# Patient Record
Sex: Male | Born: 1990 | Race: White | Hispanic: No | Marital: Married
Health system: Western US, Academic
[De-identification: ages and names within clinical notes are randomized; demographics above are authoritative.]

## PROBLEM LIST (undated history)

## (undated) DIAGNOSIS — F419 Anxiety disorder, unspecified: Secondary | ICD-10-CM

## (undated) DIAGNOSIS — F319 Bipolar disorder, unspecified: Secondary | ICD-10-CM

## (undated) DIAGNOSIS — F4310 Post-traumatic stress disorder, unspecified: Secondary | ICD-10-CM

## (undated) MED ORDER — ACETAMINOPHEN 325 MG PO TABS
650.00 mg | ORAL_TABLET | Freq: Four times a day (QID) | ORAL | Status: AC | PRN
Start: 2022-01-04 — End: 2022-01-05

## (undated) MED ORDER — NICOTINE POLACRILEX 2 MG MT GUM
2.00 mg | CHEWING_GUM | OROMUCOSAL | Status: AC | PRN
Start: 2022-01-04 — End: ?

## (undated) MED ORDER — NALOXONE HCL 0.4 MG/ML IJ SOLN
0.10 mg | INTRAMUSCULAR | Status: AC | PRN
Start: 2022-01-04 — End: 2022-01-05

## (undated) MED ORDER — ONDANSETRON HCL 4 MG OR TABS
4.00 mg | ORAL_TABLET | Freq: Four times a day (QID) | ORAL | Status: AC | PRN
Start: 2022-01-04 — End: 2022-01-05

---
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
## 2018-04-29 ENCOUNTER — Encounter (HOSPITAL_COMMUNITY): Payer: Self-pay | Admitting: Emergency Medicine

## 2018-04-29 ENCOUNTER — Emergency Department (HOSPITAL_COMMUNITY)
Admission: EM | Admit: 2018-04-29 | Discharge: 2018-04-29 | Disposition: A | Discharge status: Home / Self Care | Payer: Medicaid Other | Attending: Emergency Medicine | Admitting: Emergency Medicine

## 2018-04-29 ENCOUNTER — Emergency Department (HOSPITAL_COMMUNITY): Payer: Medicaid Other

## 2018-04-29 ENCOUNTER — Other Ambulatory Visit: Payer: Self-pay

## 2018-04-29 DIAGNOSIS — F17210 Nicotine dependence, cigarettes, uncomplicated: Secondary | ICD-10-CM | POA: Insufficient documentation

## 2018-04-29 DIAGNOSIS — Z880 Allergy status to penicillin: Secondary | ICD-10-CM | POA: Diagnosis not present

## 2018-04-29 DIAGNOSIS — Y939 Activity, unspecified: Secondary | ICD-10-CM | POA: Diagnosis not present

## 2018-04-29 DIAGNOSIS — S0990XA Unspecified injury of head, initial encounter: Secondary | ICD-10-CM | POA: Diagnosis present

## 2018-04-29 DIAGNOSIS — Z590 Homelessness: Secondary | ICD-10-CM | POA: Insufficient documentation

## 2018-04-29 DIAGNOSIS — Y929 Unspecified place or not applicable: Secondary | ICD-10-CM | POA: Diagnosis not present

## 2018-04-29 DIAGNOSIS — S060X1A Concussion with loss of consciousness of 30 minutes or less, initial encounter: Secondary | ICD-10-CM | POA: Insufficient documentation

## 2018-04-29 DIAGNOSIS — Y999 Unspecified external cause status: Secondary | ICD-10-CM | POA: Diagnosis not present

## 2018-04-29 DIAGNOSIS — M545 Low back pain: Secondary | ICD-10-CM | POA: Insufficient documentation

## 2018-04-29 HISTORY — DX: Anxiety disorder, unspecified: F41.9

## 2018-04-29 HISTORY — DX: Post-traumatic stress disorder, unspecified: F43.10

## 2018-04-29 HISTORY — DX: Bipolar disorder, unspecified: F31.9

## 2018-04-29 MED ORDER — ACETAMINOPHEN 325 MG PO TABS
650.0000 mg | ORAL_TABLET | Freq: Once | ORAL | Status: AC
  Administered 2018-04-29: 650 mg via ORAL
  Filled 2018-04-29: qty 2

## 2018-04-29 NOTE — Discharge Instructions (Addendum)
Please read instructions below. Apply ice to your areas of pain for 20 minutes at a time. You can take tylenol every 6 hours as needed for pain. Avoid contact sports/activity until you are completely symptom free or until cleared by a physician. Schedule an appointment with your primary care provider to follow up on your visit today. Return to the ER for severely worsening headache, vision changes, if new numbness or tingling in your arms or legs, inability to urinate, inability to hold your bowels, or weakness in your extremities.

## 2018-04-29 NOTE — ED Notes (Signed)
Pt seen by [employer], given homeless shelters in [place]. Will be provided with bus pass for transport to [place].
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
## 2018-04-29 NOTE — ED Notes (Signed)
CT resulted, Pt provided food and an update

## 2018-04-29 NOTE — ED Notes (Signed)
Pt requesting to see his girl and to eat, stating "what room is my girl in, I haven't had anything to f*cking eat in 24hours.  Don't you think that's unsafe." Pt made aware that after CT scan results he will be able to eat.  Pt requesting to see charge RN.

## 2018-04-29 NOTE — ED Notes (Addendum)
Pt requesting social worker regarding resources on places to stay. States that it's not safe for him in [place].  RN will alert PA

## 2018-04-29 NOTE — ED Notes (Signed)
Patient verbalizes understanding of discharge instructions. Opportunity for questioning and answers were provided. Pt discharged from ED. 
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
## 2018-04-29 NOTE — ED Notes (Signed)
Pt transported to CT ?
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
## 2022-01-03 ENCOUNTER — Emergency Department (HOSPITAL_BASED_OUTPATIENT_CLINIC_OR_DEPARTMENT_OTHER): Payer: Medicaid - Out of State

## 2022-01-03 ENCOUNTER — Inpatient Hospital Stay
Admission: EM | Admit: 2022-01-03 | Discharge: 2022-01-04 | DRG: 918 | Disposition: A | Discharge status: Home / Self Care | Payer: Medicaid - Out of State | Attending: Hospitalist | Admitting: Hospitalist

## 2022-01-03 DIAGNOSIS — Z559 Problems related to education and literacy, unspecified: Secondary | ICD-10-CM

## 2022-01-03 DIAGNOSIS — Z043 Encounter for examination and observation following other accident: Secondary | ICD-10-CM

## 2022-01-03 DIAGNOSIS — Z87892 Personal history of anaphylaxis: Secondary | ICD-10-CM

## 2022-01-03 DIAGNOSIS — T402X1A Poisoning by other opioids, accidental (unintentional), initial encounter: Principal | ICD-10-CM | POA: Diagnosis present

## 2022-01-03 DIAGNOSIS — Z8249 Family history of ischemic heart disease and other diseases of the circulatory system: Secondary | ICD-10-CM

## 2022-01-03 DIAGNOSIS — R569 Unspecified convulsions: Secondary | ICD-10-CM | POA: Diagnosis present

## 2022-01-03 DIAGNOSIS — F1510 Other stimulant abuse, uncomplicated: Secondary | ICD-10-CM | POA: Diagnosis present

## 2022-01-03 DIAGNOSIS — Z880 Allergy status to penicillin: Secondary | ICD-10-CM

## 2022-01-03 DIAGNOSIS — Z5900 Homelessness unspecified: Secondary | ICD-10-CM

## 2022-01-03 DIAGNOSIS — G8929 Other chronic pain: Secondary | ICD-10-CM | POA: Diagnosis present

## 2022-01-03 DIAGNOSIS — Z659 Problem related to unspecified psychosocial circumstances: Secondary | ICD-10-CM

## 2022-01-03 DIAGNOSIS — E8729 Other acidosis: Secondary | ICD-10-CM | POA: Diagnosis present

## 2022-01-03 DIAGNOSIS — Z87820 Personal history of traumatic brain injury: Secondary | ICD-10-CM

## 2022-01-03 DIAGNOSIS — Z881 Allergy status to other antibiotic agents status: Secondary | ICD-10-CM

## 2022-01-03 LAB — CBC WITH DIFF, BLOOD
ANC-Automated: 5.1 10*3/uL (ref 1.6–7.0)
Abs Basophils: 0 10*3/uL (ref <0.2)
Abs Eosinophils: 0.1 10*3/uL (ref 0.0–0.5)
Abs Lymphs: 1.7 10*3/uL (ref 0.8–3.1)
Abs Monos: 0.9 10*3/uL — ABNORMAL HIGH (ref 0.2–0.8)
Basophils: 0 %
Eosinophils: 1 %
Hct: 41.3 % (ref 40.0–50.0)
Hgb: 13.9 gm/dL (ref 13.7–17.5)
Lymphocytes: 22 %
MCH: 30.3 pg (ref 26.0–32.0)
MCHC: 33.7 g/dL (ref 32.0–36.0)
MCV: 90 um3 (ref 79.0–95.0)
MPV: 8.8 fL — ABNORMAL LOW (ref 9.4–12.4)
Monocytes: 11 %
Plt Count: 233 10*3/uL (ref 140–370)
RBC: 4.59 10*6/uL — ABNORMAL LOW (ref 4.60–6.10)
RDW: 13.2 % (ref 12.0–14.0)
Segs: 65 %
WBC: 7.8 10*3/uL (ref 4.0–10.0)

## 2022-01-03 LAB — COMPREHENSIVE METABOLIC PANEL, BLOOD
ALT (SGPT): 24 U/L (ref 0–41)
AST (SGOT): 34 U/L (ref 0–40)
Albumin: 4 g/dL (ref 3.5–5.2)
Alkaline Phos: 91 U/L (ref 40–129)
Anion Gap: 9 mmol/L (ref 7–15)
BUN: 11 mg/dL (ref 6–20)
Bicarbonate: 25 mmol/L (ref 22–29)
Bilirubin, Tot: 0.28 mg/dL (ref <1.2)
Calcium: 9.1 mg/dL (ref 8.5–10.6)
Chloride: 107 mmol/L (ref 98–107)
Creatinine: 0.69 mg/dL (ref 0.67–1.17)
Glucose: 107 mg/dL — ABNORMAL HIGH (ref 70–99)
Potassium: 3.3 mmol/L — ABNORMAL LOW (ref 3.5–5.1)
Sodium: 141 mmol/L (ref 136–145)
Total Protein: 7 g/dL (ref 6.0–8.0)
eGFR Based on CKD-EPI 2021 Equation: >60 mL/min/{1.73_m2}

## 2022-01-03 LAB — ECG 12-LEAD
ATRIAL RATE: 62 {beats}/min
P AXIS: 74 degrees
PR INTERVAL: 136 ms
QRS INTERVAL/DURATION: 86 ms
QT: 432 ms
QTc (Bazett): 438 ms
R AXIS: 84 degrees
T AXIS: 67 degrees
VENTRICULAR RATE: 62 {beats}/min

## 2022-01-03 LAB — VBG+O2HBV+O2S+O2CNV
BE, Ven: -2.1 mmol/L (ref <=1.2)
Flow Rate, Ven: 2 L/min
HCO3, Ven: 23 mmol/L — ABNORMAL LOW (ref 25–30)
Hct (Est), Ven: 46 % (ref 40–50)
Hgb, Ven: 15.3 g/dL (ref 14.0–17.0)
O2 Content, Ven: 20 vol %
O2 Hgb, Ven: 93 % — ABNORMAL HIGH (ref 40.0–70.0)
O2 Sat, Ven: 95.8 %
Temp, Ven: 36.7 'C
pCO2, Ven (T): 64 mmHg — ABNORMAL HIGH (ref 40–52)
pCO2, Ven (Uncorr): 65 mmHg — ABNORMAL HIGH (ref 40–52)
pH, Ven (T): 7.23 — ABNORMAL LOW (ref 7.33–7.40)
pH, Ven (Uncorr): 7.23 — ABNORMAL LOW (ref 7.33–7.40)
pO2, Ven (T): 83 mmHg — ABNORMAL HIGH (ref 25–44)
pO2, Ven (Uncorr): 85 mmHg — ABNORMAL HIGH (ref 25–44)

## 2022-01-03 LAB — VENOUS BLOOD GAS
BE, Ven: 1.7 mmol/L — ABNORMAL HIGH (ref <=1.2)
Flow Rate, Ven: 2 L/min
HCO3, Ven: 25 mmol/L (ref 25–30)
O2 Sat, Ven (Est): 63.7 %
Temp, Ven: 36.3 'C
pCO2, Ven (T): 49 mmHg (ref 40–52)
pCO2, Ven (Uncorr): 51 mmHg (ref 40–52)
pH, Ven (T): 7.36 (ref 7.33–7.40)
pH, Ven (Uncorr): 7.35 (ref 7.33–7.40)
pO2, Ven (T): 33 mmHg (ref 25–44)
pO2, Ven (Uncorr): 35 mmHg (ref 25–44)

## 2022-01-03 MED ORDER — SODIUM CHLORIDE 0.9 % IJ SOLN (CUSTOM)
3.0000 mL | Freq: Three times a day (TID) | INTRAMUSCULAR | Status: DC
Start: 2022-01-03 — End: 2022-01-04
  Administered 2022-01-03 – 2022-01-04 (×2): 3 mL via INTRAVENOUS

## 2022-01-03 MED ORDER — NALOXONE HCL 2 MG/2ML IJ SOSY
1.0000 mg | PREFILLED_SYRINGE | Freq: Once | INTRAMUSCULAR | Status: AC
Start: 2022-01-03 — End: 2022-01-03
  Administered 2022-01-03: 1 mg via INTRAVENOUS

## 2022-01-03 MED ORDER — ENOXAPARIN SODIUM 40 MG/0.4ML IJ SOSY
40.0000 mg | PREFILLED_SYRINGE | Freq: Every day | INTRAMUSCULAR | Status: DC
Start: 2022-01-04 — End: 2022-01-04
  Administered 2022-01-04: 40 mg via SUBCUTANEOUS
  Filled 2022-01-03: qty 1

## 2022-01-03 MED ORDER — SODIUM CHLORIDE 0.9% TKO INFUSION
INTRAVENOUS | Status: DC | PRN
Start: 2022-01-03 — End: 2022-01-04

## 2022-01-03 MED ORDER — NALOXONE HCL 2 MG/2ML IJ SOSY
1.0000 mg | PREFILLED_SYRINGE | Freq: Once | INTRAMUSCULAR | Status: DC
Start: 2022-01-03 — End: 2022-01-03
  Filled 2022-01-03: qty 2

## 2022-01-03 MED ORDER — SODIUM CHLORIDE 0.9 % IJ SOLN (CUSTOM)
3.0000 mL | INTRAMUSCULAR | Status: DC | PRN
Start: 2022-01-03 — End: 2022-01-04

## 2022-01-03 MED ORDER — ACETAMINOPHEN 325 MG PO TABS
650.0000 mg | ORAL_TABLET | Freq: Four times a day (QID) | ORAL | Status: DC | PRN
Start: 2022-01-03 — End: 2022-01-04
  Administered 2022-01-03: 650 mg via ORAL
  Filled 2022-01-03: qty 2

## 2022-01-03 MED ORDER — NALOXONE HCL 2 MG/2ML IJ SOSY
PREFILLED_SYRINGE | INTRAMUSCULAR | Status: DC
Start: 2022-01-03 — End: 2022-01-03
  Filled 2022-01-03: qty 2

## 2022-01-03 MED ORDER — NALMEFENE HCL 1 MG/ML IJ SOLN
1.0000 mg | Freq: Once | INTRAMUSCULAR | Status: AC
Start: 2022-01-03 — End: 2022-01-03
  Administered 2022-01-03: 1 mg via INTRAVENOUS
  Filled 2022-01-03: qty 2

## 2022-01-03 MED ORDER — NALOXONE HCL 0.4 MG/ML IJ SOLN
0.1000 mg | INTRAMUSCULAR | Status: DC | PRN
Start: 2022-01-03 — End: 2022-01-04

## 2022-01-03 MED ORDER — POTASSIUM CHLORIDE CRYS CR 20 MEQ OR TBCR
40.0000 meq | EXTENDED_RELEASE_TABLET | Freq: Once | ORAL | Status: AC
Start: 2022-01-03 — End: 2022-01-03
  Administered 2022-01-03: 40 meq via ORAL
  Filled 2022-01-03: qty 2

## 2022-01-03 MED ORDER — NALOXONE HCL 0.4 MG/ML IJ SOLN
0.4000 mg | Freq: Once | INTRAMUSCULAR | Status: AC
Start: 2022-01-03 — End: 2022-01-03

## 2022-01-03 MED ORDER — NALOXONE HCL 0.4 MG/ML IJ SOLN
INTRAMUSCULAR | Status: AC
Start: 2022-01-03 — End: 2022-01-03
  Administered 2022-01-03: 0.4 mg via INTRAVENOUS
  Filled 2022-01-03: qty 2
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
## 2022-01-03 NOTE — ED Notes (Signed)
MD notified of decreased respiratory drive, orders pending.

## 2022-01-03 NOTE — ED Notes (Signed)
MD aware - pts pupils are pinpoint, respiratory rate decreased. Narcan ordered
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
## 2022-01-03 NOTE — ED Notes (Signed)
Report given to floor RN to assume care of patient.

## 2022-01-03 NOTE — ED Notes (Addendum)
Assisting primary RN: pt awake/alert, anxious, changing positions frequently, monitor leads coming off and needing to be placed back on multiple times, pt following commands but needing frequent direction, friend at side attempting to assist in direction

## 2022-01-03 NOTE — Discharge Instructions (Signed)
You were seen today after use of an opiate. You were given naloxone by EMS and you were monitored in the emergency department. Opioids are dangerous drugs and can cause respiratory depression and even death. The strength of street drugs is constantly changing, and even using smaller doses in the future can cause death. Please stop using opioids and other drugs. Please return to the ER for trouble breathing, confusion, fevers/chills, or other concerns.

## 2022-01-03 NOTE — ED Notes (Signed)
Pt provided meal tray

## 2022-01-03 NOTE — ED Notes (Signed)
Respirations increased to 13BPM following narcan administration.

## 2022-01-03 NOTE — ED Notes (Signed)
Medicated per orders - pt remains on  monitor, alarms on full. Will continue to reassess.

## 2022-01-03 NOTE — ED Notes (Signed)
This Probation officer completed ambulation trial for this pt. Pt ambulated with a steady gait and no complaints or concerns were made by pt. Pt is back on gurney. Nurse aware.

## 2022-01-03 NOTE — ED Notes (Signed)
Introduced myself to patient and girlfriend at bedside after assuming care. Patient sleeping, wakes easily and answers questions appropriately. Respirations unlabored, oxygen saturation stable at 98% on 2L NC. Discussed plan for admit, awaiting bed assignment.

## 2022-01-03 NOTE — ED Notes (Signed)
Pinpoint pupils, decreasing respiratory effort. Narcan given IV

## 2022-01-03 NOTE — ED Notes (Signed)
Pt difficult to arouse with verbal and physical stimulation. MD aware.

## 2022-01-03 NOTE — ED Provider Notes (Signed)
Emergency Department Note  [facility] electronic medical record reviewed for pertinent medical history.     Patient: Kerry Neal, MRN [id number], DOB Oct 08, 1990  The Date of Service for the Emergency Room encounter is No admission date for patient encounter.   Primary MD: No primary care provider on file.    Nursing Triage Note: No chief complaint on file.      HPI: Kerry Neal is a 31 year old male p/w opioid overdose. Patient was trying to smoke weed outside a gas station with girlfriend. Was given a white piece of paper, licked it to roll a joint. After that, became unresponsive. Girlfriend started chest compressions, eventually gas station called 911. Given 8 mg narcan total, 3 rounds intranasal, 1 round IM. When EMS arrived, patient had a pulse, were bagging patient with BVM. After 4th round of narcan, patient woke up spontaneously on stretcher. Exam with pinpoint pupils, diaphoretic, but answers questions appropriately, able to follow commansd     Otherwise denies fevers, CP, SOB, abdominal pain, nausea, vomiting, diarrhea, dysuria, hematuria, melena, hematochezia. Denies headache, numbness, weakness, balance or walking issues. Denies cough or recent viral illness. No recent travel.     This patient arrived by EMS. I have received a verbal report directly to me as to the care prior to arrival including: on scene events, vital signs, glucose and medications administered. Information obtained from EMS: hpi       External Data Sources (Select all that apply):      Past Medical & Surgical History:No past medical history on file.No past surgical history on file.    Family History:No family history on file.    Social History:   Tobacco: Denies  Alcohol: Denies   Illicit Drugs: marijuana, cocaine   Homeless     Home Medications:  Cannot display prior to admission medications because the patient has not been admitted in this contact.       Allergies:Patient has no allergy information on record.    Review of  Systems:  ROS  All other systems reviewed and negative unless otherwise noted in the HPI or above. This was done per my custom and practice for systems appropriate to the chief complaint in an emergency department setting and varies depending on the quality of history that the patient is able to provide.      Physical Exam:  Vitals:    01/03/22 1239 01/03/22 1341 01/03/22 1353 01/03/22 1452   BP: 123/81 (P) 123/88  (!) (P) 144/104   BP Location: Right arm (P) Right arm  (P) Right arm   BP Patient Position: Semi-Fowlers (P) Semi-Fowlers  (P) Semi-Fowlers   Pulse: 62 (P) 63  (P) 75   Resp: 22 (P) 15 (P) 10 (P) 9   Temp:       SpO2: 100% (P) 100% (P) 99% (P) 94%   Weight:         Temperature:  [96.8 F (36 C)] (P) 96.8 F (36 C) (11/05 1120)  Blood pressure (BP): (123-138)/(81-89) (P) 144/104 (11/05 1452)  Heart Rate:  [62-85] (P) 75 (11/05 1452)  Respirations:  [8-22] (P) 9 (11/05 1452)  Pain Score: (P) Patient Sleeping, Respiratory Assessment Done (11/05 1452)  O2 Device: (P) None (Room air) (11/05 1452)  SpO2:  [93 %-100 %] (P) 94 % (11/05 1452)    GEN: no acute distress  HEENT: NC/AT, PERRL, EOMI without nystagmus, MMM, pinpoint pupils on initial evaluation   NECK: Supple, trachea midline with normal ROM  CV: RRR  no m/r/g, peripheral pulses 2+ and equal in all extremities  PULM: CTAB, no increased WOB.  GI: Soft, nondistended, nontender. Bowel sounds present. No rebound or guarding.  BACK: No midline tenderness, no CVAT  MSK: No edema , tenderness, full ROM  NEURO: A&O x3, moving all 4 extremities spontaneously, no focal deficits appreciated, ambulates with steady gait   SKIN: Warm, dry, no rashes  PSYCH: Appropriate mood and affect       Asssessment/Plan/MDM:  Patient is a 31 year old male with history as above who presents after suspected opioid overdose. Given 8 mg narcan total prior to arrival. Given history, physical exam, and response to interventions, low suspicion for posterior circulation stroke, alcohol  intoxication, intracranial bleed, sepsis, hypothyroidism, or environmental hypothermia. Labs, imaging, and symptom control as listed below. Will place on cardiac monitor and telemetry. Ambulation and po trial prior to discharge.     Given 1.4 mg narcan here for decreased respiratory drive. Appropriate response after narcan administration. Labs and imaging generally unremarkable.     At time of discharge, patient is speaking in full sentences with clear speech. Alert and oriented to person, place, and time. Discussed findings with patient and answered questions. Thorough discussion regarding plan of care for discharge and strict return precautions, patient verbalized understanding. Discharged home in stable condition.    Orders Placed This Encounter   Procedures   . X-Ray Chest Single View   . CMP   . CBC w/ Diff [place]   . ECG 12 Lead      Medications ordered in this Encounter   Medications   . nalOXone (NARCAN) injection 0.4 mg   . nalOXone (NARCAN) 0.4 mg/mL injection  - ADS OVERRIDE     Created by cabinet override   . nalOXone injection 1 mg   . nalOXone 2 MG/2ML injection  - ADS OVERRIDE     Created by cabinet override   . potassium chloride (KLOR-CON M20) ER tablet 40 mEq        Dx/tx limited by SDOH: Problems related to education and literacy and Problems related to psychosocial circumstances. These factors potentially limit the patient's ability to obtain adequate follow up and/or comply with treatment.    ED Course as of 01/03/22 1720   Shireen Quan [facility] Documentation   Sun Jan 03, 2022   1102 31 yo M p/w opioid overdose. Patient was trying to smoke weed outside a gas station with girlfriend. Was given a white piece of paper, licked it to roll a joint. After that, became unresponsive. Girlfriend started chest compressions, eventually gas station called 911. Given 8 mg narcan total, 3 rounds intranasal, 1 round IM. When EMS arrived, patient had a pulse, were bagging patient with BVM. After 4th round of  narcan, patient woke up spontaneously on stretcher. Exam with pinpoint pupils, diaphoretic, but answers questions appropriately, able to follow commansd    1156 Will give narcan for shallow respirations    1256 CXR: Frontal view of the chest. The cardiac silhouette and mediastinal contours are within normal limits. Minimal increased density over the left greater than right upper lobes may reflect a summation of shadows of overlying shadows with a developing multifocal parenchymal process (possible aspiration) possible. Short-term follow-up is indicated. No expanding effusions or expanding pneumothorax. No definite displaced fracture seen.      Others' Documentation   Sun Jan 03, 2022   1253 Ecg NSR rate 62, nml intervals [BS]   3126 S/o  31 year old male  PMHx  presenting w/ opioid overdose. 8mg  +1.5mg  narcan here. Labs/imaging unremarkable. Likely okay to d/c @1500  (last dose at 12:30)    [ ]  ReSS @ 1500   [NS]   1418 Sign out note  Opioid overdose s/p narcan 8mg  and now s/p narcan here.  If doing well, anticipate d/c [AK]   1653 Medicine paged as patient requiring repeat dosing of opioid reversal.  End-tidal CO2 60.  ABG ordered pH 7.23, pCO2 64.  [NS]   1718 Pt became hypercarbic again with pCO2 in 60's and sonorous respirations. Nalmafene administered and pCO2 35 with pox 98%.  Will admit for hypercarbia due to opioid toxicity and repeat opioid reversal agent administration [AK]      ED Course User Index  [AK] , MD  [BS] , MD  [NS] , MD         ED Clinical Impression as of 01/03/22 1720   Opioid overdose, accidental or unintentional, initial encounter (CMS-HCC)        ED Clinical Impression:  1. Opioid overdose, accidental or unintentional, initial encounter (CMS-HCC)                Data Reviewed:  CXR (interpreted by me): negative for acute findings  EKG (interpreted by me): No STEMI    Risk of Complications and/or  Morbidity:          Disposition: Discharge    This note was created using voice recognition technology.  Due to environmental circumstances and may contain errors.  The errors include but are not limited to grammatical errors, punctuation errors, spelling errors, etc.     Patient seen and discussed with ED attending, Dr. Serita Grit, MD  Resident Physician, PGY-2  [facility]       Ladell Heads, MD  Resident  01/03/22 1513       13/05/23, MD  01/04/22 1432

## 2022-01-03 NOTE — ED Notes (Signed)
Report rcvd from Kevin, RN

## 2022-01-04 ENCOUNTER — Other Ambulatory Visit: Payer: Self-pay

## 2022-01-04 DIAGNOSIS — T402X1A Poisoning by other opioids, accidental (unintentional), initial encounter: Principal | ICD-10-CM | POA: Diagnosis present

## 2022-01-04 LAB — BASIC METABOLIC PANEL, BLOOD
Anion Gap: 11 mmol/L (ref 7–15)
BUN: 7 mg/dL (ref 6–20)
Bicarbonate: 25 mmol/L (ref 22–29)
Calcium: 9.3 mg/dL (ref 8.5–10.6)
Chloride: 103 mmol/L (ref 98–107)
Creatinine: 0.66 mg/dL — ABNORMAL LOW (ref 0.67–1.17)
Glucose: 95 mg/dL (ref 70–99)
Potassium: 3.8 mmol/L (ref 3.5–5.1)
Sodium: 139 mmol/L (ref 136–145)
eGFR Based on CKD-EPI 2021 Equation: >60 mL/min/{1.73_m2}

## 2022-01-04 LAB — PHOSPHORUS, BLOOD: Phosphorous: 3.5 mg/dL (ref 2.7–4.5)

## 2022-01-04 LAB — HIV 1/2 ANTIBODY & P24 ANTIGEN ASSAY, BLOOD: HIV 1/2 Antibody & P24 Antigen Assay: NONREACTIVE

## 2022-01-04 LAB — MAGNESIUM, BLOOD: Magnesium: 1.6 mg/dL (ref 1.6–2.6)

## 2022-01-04 MED ORDER — NALOXONE HCL 4 MG/0.1ML NA LIQD
NASAL | 0 refills | Status: AC
Start: 2022-01-04 — End: ?
  Filled 2022-01-04: qty 2, 1d supply, fill #0

## 2022-01-04 MED ORDER — INFLUENZA VAC SPLIT QUAD 0.5 ML IM SUSY
0.5000 mL | PREFILLED_SYRINGE | INTRAMUSCULAR | Status: AC
Start: 2022-01-04 — End: 2022-01-04
  Administered 2022-01-04: 0.5 mL via INTRAMUSCULAR
  Filled 2022-01-04: qty 0.5
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
## 2022-01-04 NOTE — Progress Notes (Signed)
Discharge Progress Note:    S:  Feeling back to normal- wants to leave as soon as possible.   No SOB, fevers, chills.    O:   01/03/22  2152 01/04/22  0010 01/04/22  0411 01/04/22  0748   BP:  112/73 114/72 112/90   Pulse:  56 66 60   Temp:  97.5 F (36.4 C) 97.9 F (36.6 C) 97.7 F (36.5 C)   Resp: 13 13 12 14    SpO2: 96% 97% 97% 98%       Exam:  NAD, comfortable  Moving air well, CTAB  Soft, NTTP    A/P:    Tele reviewed- sinus arrythmia, not afib- also reviewed EKG on admission    Patient to be discharged today. Discussed narcan      Discharge summary to follow.

## 2022-01-04 NOTE — Interdisciplinary (Signed)
Patient's PIV removed. Wife/patient provided with snacks and bus pass. Patient declines printed AVS, but verbally reviewed d/c instructions. Patient verbally acknowledges understanding of d/c information and f/u instruction. Appears calm and stable at this time w/no s/s of SOB or acute distress. Patient escorted to pharmacy by RN to pick up narcan.
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
## 2022-01-05 LAB — MRSA SURVEILLANCE CULTURE

## 2022-04-09 ENCOUNTER — Inpatient Hospital Stay
Admission: EM | Admit: 2022-04-09 | Discharge: 2022-04-10 | DRG: 602 | Discharge status: Against Medical Advice | Payer: Medicaid - Out of State | Attending: Hospitalist | Admitting: Hospitalist

## 2022-04-09 ENCOUNTER — Emergency Department (HOSPITAL_BASED_OUTPATIENT_CLINIC_OR_DEPARTMENT_OTHER): Payer: Medicaid - Out of State

## 2022-04-09 DIAGNOSIS — L0390 Cellulitis, unspecified: Secondary | ICD-10-CM

## 2022-04-09 DIAGNOSIS — Z639 Problem related to primary support group, unspecified: Secondary | ICD-10-CM

## 2022-04-09 DIAGNOSIS — R000 Tachycardia, unspecified: Secondary | ICD-10-CM

## 2022-04-09 DIAGNOSIS — D72829 Elevated white blood cell count, unspecified: Secondary | ICD-10-CM

## 2022-04-09 DIAGNOSIS — F1110 Opioid abuse, uncomplicated: Secondary | ICD-10-CM | POA: Diagnosis present

## 2022-04-09 DIAGNOSIS — W57XXXA Bitten or stung by nonvenomous insect and other nonvenomous arthropods, initial encounter: Secondary | ICD-10-CM | POA: Diagnosis present

## 2022-04-09 DIAGNOSIS — S60420A Blister (nonthermal) of right index finger, initial encounter: Secondary | ICD-10-CM | POA: Diagnosis present

## 2022-04-09 DIAGNOSIS — Z23 Encounter for immunization: Secondary | ICD-10-CM

## 2022-04-09 DIAGNOSIS — A419 Sepsis, unspecified organism: Principal | ICD-10-CM | POA: Diagnosis present

## 2022-04-09 DIAGNOSIS — M79644 Pain in right finger(s): Secondary | ICD-10-CM

## 2022-04-09 DIAGNOSIS — F1510 Other stimulant abuse, uncomplicated: Secondary | ICD-10-CM

## 2022-04-09 DIAGNOSIS — W269XXA Contact with unspecified sharp object(s), initial encounter: Secondary | ICD-10-CM

## 2022-04-09 DIAGNOSIS — Z609 Problem related to social environment, unspecified: Secondary | ICD-10-CM | POA: Diagnosis present

## 2022-04-09 DIAGNOSIS — F1910 Other psychoactive substance abuse, uncomplicated: Secondary | ICD-10-CM

## 2022-04-09 DIAGNOSIS — Z87820 Personal history of traumatic brain injury: Secondary | ICD-10-CM

## 2022-04-09 DIAGNOSIS — F1590 Other stimulant use, unspecified, uncomplicated: Secondary | ICD-10-CM | POA: Diagnosis present

## 2022-04-09 DIAGNOSIS — Z5989 Other problems related to housing and economic circumstances: Secondary | ICD-10-CM

## 2022-04-09 DIAGNOSIS — L0889 Other specified local infections of the skin and subcutaneous tissue: Principal | ICD-10-CM | POA: Diagnosis present

## 2022-04-09 DIAGNOSIS — Z5329 Procedure and treatment not carried out because of patient's decision for other reasons: Secondary | ICD-10-CM | POA: Diagnosis present

## 2022-04-09 LAB — COMPREHENSIVE METABOLIC PANEL, BLOOD
ALT (SGPT): 17 U/L (ref 0–41)
AST (SGOT): 22 U/L (ref 0–40)
Albumin: 4 g/dL (ref 3.5–5.2)
Alkaline Phos: 95 U/L (ref 40–129)
Anion Gap: 11 mmol/L (ref 7–15)
BUN: 13 mg/dL (ref 6–20)
Bicarbonate: 24 mmol/L (ref 22–29)
Bilirubin, Tot: 0.21 mg/dL (ref <1.2)
Calcium: 9.6 mg/dL (ref 8.5–10.6)
Chloride: 101 mmol/L (ref 98–107)
Creatinine: 0.66 mg/dL — ABNORMAL LOW (ref 0.67–1.17)
Glucose: 85 mg/dL (ref 70–99)
Potassium: 4.9 mmol/L (ref 3.5–5.1)
Sodium: 136 mmol/L (ref 136–145)
Total Protein: 6.6 g/dL (ref 6.0–8.0)
eGFR Based on CKD-EPI 2021 Equation: >60 mL/min/{1.73_m2}

## 2022-04-09 LAB — INFLUENZA A/B & SARS-COV-2 PCR COMBO FOR RAPID RESPONSE LAB
Influenza A PCR, RRL: NOT DETECTED
Influenza B PCR, RRL: NOT DETECTED
SARS-CoV-2 PCR, RRL: NOT DETECTED

## 2022-04-09 LAB — CBC WITH DIFF, BLOOD
ANC-Automated: 16.7 10*3/uL — ABNORMAL HIGH (ref 1.6–7.0)
Abs Basophils: 0.1 10*3/uL (ref <0.2)
Abs Eosinophils: 0.1 10*3/uL (ref 0.0–0.5)
Abs Lymphs: 1.5 10*3/uL (ref 0.8–3.1)
Abs Monos: 0.8 10*3/uL (ref 0.2–0.8)
Basophils: 0 %
Eosinophils: 0 %
Hct: 39.1 % — ABNORMAL LOW (ref 40.0–50.0)
Hgb: 12.9 gm/dL — ABNORMAL LOW (ref 13.7–17.5)
Imm Gran %: 1 % — ABNORMAL HIGH (ref <1)
Imm Gran Abs: 0.1 10*3/uL — ABNORMAL HIGH (ref <0.1)
Lymphocytes: 8 %
MCH: 29.5 pg (ref 26.0–32.0)
MCHC: 33 g/dL (ref 32.0–36.0)
MCV: 89.3 um3 (ref 79.0–95.0)
MPV: 9.7 fL (ref 9.4–12.4)
Monocytes: 4 %
Plt Count: 285 10*3/uL (ref 140–370)
RBC: 4.38 10*6/uL — ABNORMAL LOW (ref 4.60–6.10)
RDW: 14.6 % — ABNORMAL HIGH (ref 12.0–14.0)
Segs: 87 %
WBC: 19.2 10*3/uL — ABNORMAL HIGH (ref 4.0–10.0)

## 2022-04-09 LAB — SED RATE, BLOOD: Sed Rate: 6 mm/hr (ref 0–15)

## 2022-04-09 LAB — C-REACTIVE PROTEIN, BLOOD: CRP: 0.38 mg/dL (ref <0.5)

## 2022-04-09 LAB — LACTATE, BLOOD: Lactate: 1.4 mmol/L (ref 0.5–2.0)

## 2022-04-09 MED ORDER — SODIUM CHLORIDE 0.9 % IV SOLN
1000.0000 mg | INTRAVENOUS | Status: DC
Start: 2022-04-10 — End: 2022-04-09

## 2022-04-09 MED ORDER — TETANUS-DIPHTH-ACELL PERTUSSIS 5-2.5-18.5 LF-MCG/0.5 IM WRAPPED RECORD
0.5000 mL | Freq: Once | INTRAMUSCULAR | Status: AC
Start: 2022-04-09 — End: 2022-04-09
  Administered 2022-04-09: 0.5 mL via INTRAMUSCULAR
  Filled 2022-04-09: qty 0.5

## 2022-04-09 MED ORDER — VANCOMYCIN PER PHARMACY
INTRAVENOUS | Status: DC
Start: 2022-04-09 — End: 2022-04-10

## 2022-04-09 MED ORDER — SODIUM CHLORIDE 0.9 % IV SOLN
1000.0000 mg | Freq: Once | INTRAVENOUS | Status: AC
Start: 2022-04-09 — End: 2022-04-09
  Administered 2022-04-09: 1000 mg via INTRAVENOUS
  Filled 2022-04-09: qty 1000

## 2022-04-09 MED ORDER — VANCOMYCIN PER PHARMACY
INTRAVENOUS | Status: DC
Start: 2022-04-09 — End: 2022-04-09

## 2022-04-09 MED ORDER — LACTATED RINGERS IV SOLN
Freq: Once | INTRAVENOUS | Status: AC
Start: 2022-04-09 — End: 2022-04-09

## 2022-04-09 MED ORDER — VANCOMYCIN HCL 1.5 G IV SOLR
1500.0000 mg | Freq: Once | INTRAVENOUS | Status: AC
Start: 2022-04-09 — End: 2022-04-10
  Administered 2022-04-09: 1500 mg via INTRAVENOUS
  Filled 2022-04-09: qty 1500

## 2022-04-09 MED ORDER — KETOROLAC TROMETHAMINE 30 MG/ML IJ SOLN
15.0000 mg | Freq: Once | INTRAMUSCULAR | Status: AC
Start: 2022-04-09 — End: 2022-04-09
  Administered 2022-04-09: 15 mg via INTRAVENOUS
  Filled 2022-04-09: qty 1

## 2022-04-09 NOTE — ED Notes (Signed)
Pt changed into gown, pt's girlfriend assisting.
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
## 2022-04-09 NOTE — H&P (Shared)
Admission History & Physical   Patient: Kerry Neal, Kerry Neal 10-Jun-1990, [id number]   Date of face to face patient encounter: 04/09/2022     Chief Complaint       History of Present Illness   Kerry Neal is a 32 year old male with a history of TBI, seizure disorder, polysubstance use, presenting with finger pain.       Patient is accompanied by fiance who provides majority of the history.     Patient presents with pain involving R index finger after wound from broken spring or exposed nail under couch seat cushion. Reports pain has progressed ***. Reports associated discharge appearing similar to pus, development of dark blister. Reports associated generalized malaise, pale appearance, generally feeling unwell, experiencing subjective fevers and chills.     Denies cough, URI symptoms, headaches, vision changes.     Reports abdominal pain, intermittent. Reports episode of diarrhea in AM. Denies nausea, vomiting. Reports fiance with recent episode of gastroenteritis within last 2 weeks.     Reports intermittent chest pain, currently mild.***    Endorses subjective fevers, chills.     Patient endorses a history of IVDU. Reports current methamphetamine use, last ***.      Reports 1 ppd. Denies alcohol. Endor    ED Course:  [facility]    Past Medical/Surgical History   Past Medical History  No past medical history on file.  Immunization History   Administered Date(s) Administered    Influenza Vaccine >=6 Months 01/04/2022    Tdap 04/09/2022       Past Surgical History  No past surgical history on file.      Medications     Current Outpatient Medications   Medication Instructions    naloxone (NARCAN) 4 mg/0.1 mL nasal spray FOR SUSPECTED OVERDOSE, call 911! Tilt head, spray one spray into one nostril as needed for respiratory depression. If patient does not respond or responds and then relapses, repeat using a new nasal spray TO THE OTHER NOSTRIL every 3 minutes until emergency medical assistance arrives.       Allergies     Allergies    Allergen Reactions    Cefaclor Anaphylaxis    Penicillins Anaphylaxis    Lamictal [Lamotrigine] Rash       Social History   He has no history on file for tobacco use.  He has no history on file for alcohol use.  He has no history on file for drug use.    Family History   No family history on file.    Physical Exam   Temperature:  [98.1 F (36.7 C)-98.4 F (36.9 C)] 98.1 F (36.7 C) (02/09 1950)  Blood pressure (BP): (119)/(72-85) 119/72 (02/09 2238)  Heart Rate:  [88-124] 88 (02/09 2238)  Respirations:  [19-23] 23 (02/09 2238)  Pain Score: 10 (02/09 2053)  O2 Device: None (Room air) (02/09 1732)  SpO2:  [100 %] 100 % (02/09 1732)  Wt Readings from Last 1 Encounters:   04/09/22 69 kg (152 lb 1.9 oz)     No intake/output data recorded.    General: well-appearing, answering questions appropriately, NAD  HEENT: EOMI, PERRL, oropharynx clear  Neck: supple, no LAD, no JVD  Cardiac: RRR, no r/m/g  Pulm: normal respiratory effort, lungs CTAB, no w/r/r  Abd: soft, non-tender, non-distended, no palpable masses  Extremities: warm, no cyanosis/ clubbing/ peripheral edema      Data Review     Labs  {LABS LIST INPATIENT:20449}       Microbiology  Imaging  X-Ray Hand Minimum 3 Views - Right    Result Date: 04/09/2022  IMPRESSION: No soft tissue gas, radiopaque foreign body or acute osseous abnormality          Assessment and Plan   Kerry Neal is a 32 year old year old male with a history of   ?  {A/P Template:34774}    Discharge planning: {Discharge Barriers:16757}  Foley: {FOLEY:16760}  VTE prophylaxis: {VTE Prophylaxis:16761}  Diet: Diet Regular  Last BM:    IV fluids: {IV Fluids:16449}  Access: {Access:16771}  Code status: History    Hamilton Capri, MD  Internal Medicine PGY-3    04/09/2022, 10:44 PM     Pt. seen and discussed with Kermit Balo, MD, attending physician on 04/09/2022, who agrees with the above plan.
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
## 2022-04-09 NOTE — ED MD Progress Note (Signed)
HPI:  The patient is a 32 year old male with history of TBI, seizures, polysubstance abuse, opiate overdose who presents with possible sepsis. The history is obtained from the patient. The patient reports he has pain and blister to the right index finger.  Patient reports previously having sepsis back in November.  Reports having some forearm discomfort as well.  Unsure if he has fever.  PE:  The patient is a disheveled male in moderate distress secondary to pain. He is awake alert and oriented x3.   HEENT examination is significant for ears that are clear bilaterally. Mouth has moist mucous membranes.   Pulmonary examination is significant for lungs clear to auscultation bilaterally.  Cardiac examination significant for regular rate and rhythm no murmur.  Abdominal exam had positive bowel sounds soft, with no tenderness.  Back examination significant for no CVA tenderness or spinal tenderness.  Extremity examination significant for 2/2 pulses.  Right upper extremity significant for blister to the distal portion of the index finger at the PIP and D IP joints.  Blisters currently intact.  No erythema.  There is tenderness around the blister.  Skin exam significant for multiple skin picking from prior methamphetamine use    Impression: The patient is a 32 year old male with history of TBI, seizures, polysubstance abuse, opiate overdose who is presenting with possible sepsis. Differential diagnosis includes:  Finger blister, burn, infection, flexor tenosynovitis, hand infection, high pressure tendon injury, sepsis    Medical Decision Making: The emergency room plan is to:   Place an IV and obtaining laboratory studies looking for:  Obtain a CBC looking for Leukocytosis and anemia.  Obtain Chemistries looking for Electrolyte abnormalities.  Obtain liver function tests looking for hepatic injury  Obtain a total CPK looking for rhabdomyolysis.  Obtain a lactate level looking for signs of early sepsis.  Obtain Blood  Cultures for possible sepsis.  Obtain EKG looking for cardiac Ischemia and dysrhythmia or ST elevations on EKG.  Obtain a CRP and Sedimentation rate looking for high sensitivity of infectious process.  Obtain a Urinalysis obtained for signs hematuria or infection.  Obtain a Urine toxicology screen for medications that may be contributing to the patient's symptoms.  Obtain right hand image looking for retained foreign body, fracture or dislocation.
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
## 2022-04-09 NOTE — ED Notes (Addendum)
Charted in error.

## 2022-04-10 ENCOUNTER — Other Ambulatory Visit: Payer: Self-pay

## 2022-04-10 DIAGNOSIS — A419 Sepsis, unspecified organism: Secondary | ICD-10-CM

## 2022-04-10 LAB — CBC WITH DIFF, BLOOD
ANC-Automated: 7.6 10*3/uL — ABNORMAL HIGH (ref 1.6–7.0)
Abs Basophils: 0 10*3/uL (ref <0.2)
Abs Eosinophils: 0.2 10*3/uL (ref 0.0–0.5)
Abs Lymphs: 3.1 10*3/uL (ref 0.8–3.1)
Abs Monos: 0.7 10*3/uL (ref 0.2–0.8)
Basophils: 0 %
Eosinophils: 2 %
Hct: 37.3 % — ABNORMAL LOW (ref 40.0–50.0)
Hgb: 12 gm/dL — ABNORMAL LOW (ref 13.7–17.5)
Imm Gran %: 2 % (ref <1)
Imm Gran Abs: 0.2 10*3/uL (ref <0.1)
Lymphocytes: 27 %
MCH: 29.3 pg (ref 26.0–32.0)
MCHC: 32.2 g/dL (ref 32.0–36.0)
MCV: 91 um3 (ref 79.0–95.0)
MPV: 9.3 fL — ABNORMAL LOW (ref 9.4–12.4)
Monocytes: 6 %
Plt Count: 258 10*3/uL (ref 140–370)
RBC: 4.1 10*6/uL — ABNORMAL LOW (ref 4.60–6.10)
RDW: 14.9 % — ABNORMAL HIGH (ref 12.0–14.0)
Segs: 64 %
WBC: 11.8 10*3/uL — ABNORMAL HIGH (ref 4.0–10.0)

## 2022-04-10 LAB — URINALYSIS WITH CULTURE REFLEX, WHEN INDICATED
Bilirubin: NEGATIVE
Blood: NEGATIVE
Glucose: NEGATIVE
Ketones: NEGATIVE
Leuk Esterase: NEGATIVE Leu/uL
Nitrite: NEGATIVE
Specific Gravity: 1.025 (ref 1.002–1.030)
Urobilinogen: NEGATIVE
pH: 5.5 (ref 5.0–8.0)

## 2022-04-10 LAB — UR DRUGS OF ABUSE SCREEN
Barbiturates Screen: NEGATIVE
Cocaine Screen: NEGATIVE
Methadone Screen: NEGATIVE
Opiates Screen: NEGATIVE
Oxycodone Screen: NEGATIVE
Phencyclidine Screen: NEGATIVE
UR Benzodiazepines High Sensitivity Screen: NEGATIVE
UR Fentanyl Screen: NEGATIVE

## 2022-04-10 LAB — ECG 12-LEAD
ATRIAL RATE: 115 {beats}/min
P AXIS: 70 degrees
PR INTERVAL: 126 ms
QRS INTERVAL/DURATION: 76 ms
QT: 324 ms
QTc (Bazett): 448 ms
QTc (Fredericia): 402 ms
R AXIS: 86 degrees
T AXIS: 29 degrees
VENTRICULAR RATE: 115 {beats}/min

## 2022-04-10 LAB — BASIC METABOLIC PANEL, BLOOD
Anion Gap: 11 mmol/L (ref 7–15)
BUN: 13 mg/dL (ref 6–20)
Bicarbonate: 24 mmol/L (ref 22–29)
Calcium: 9.1 mg/dL (ref 8.5–10.6)
Chloride: 106 mmol/L (ref 98–107)
Creatinine: 0.78 mg/dL (ref 0.67–1.17)
Glucose: 130 mg/dL — ABNORMAL HIGH (ref 70–99)
Potassium: 3.7 mmol/L (ref 3.5–5.1)
Sodium: 141 mmol/L (ref 136–145)
eGFR Based on CKD-EPI 2021 Equation: >60 mL/min/{1.73_m2}

## 2022-04-10 LAB — PHOSPHORUS, BLOOD: Phosphorous: 4.5 mg/dL (ref 2.7–4.5)

## 2022-04-10 LAB — MAGNESIUM, BLOOD: Magnesium: 2.1 mg/dL (ref 1.6–2.6)

## 2022-04-10 MED ORDER — GABAPENTIN 300 MG OR CAPS
300.0000 mg | ORAL_CAPSULE | Freq: Once | ORAL | Status: AC
Start: 2022-04-10 — End: 2022-04-10
  Administered 2022-04-10: 300 mg via ORAL
  Filled 2022-04-10: qty 1

## 2022-04-10 MED ORDER — MUPIROCIN 2 % EX OINT
TOPICAL_OINTMENT | Freq: Two times a day (BID) | CUTANEOUS | Status: DC
Start: 2022-04-10 — End: 2022-04-10
  Filled 2022-04-10: qty 22

## 2022-04-10 MED ORDER — DOXYCYCLINE MONOHYDRATE 100 MG OR CAPS
100.0000 mg | ORAL_CAPSULE | Freq: Two times a day (BID) | ORAL | 0 refills | Status: DC
Start: 2022-04-10 — End: 2022-04-10
  Filled 2022-04-10: qty 14, 7d supply, fill #0

## 2022-04-10 MED ORDER — VANCOMYCIN HCL 1 GM IV SOLR
1000.0000 mg | Freq: Four times a day (QID) | INTRAVENOUS | Status: DC
Start: 2022-04-10 — End: 2022-04-10
  Administered 2022-04-10 (×2): 1000 mg via INTRAVENOUS
  Filled 2022-04-10 (×2): qty 1000

## 2022-04-10 MED ORDER — KETOROLAC TROMETHAMINE 15 MG/ML IJ SOLN
15.0000 mg | Freq: Once | INTRAMUSCULAR | Status: AC
Start: 2022-04-10 — End: 2022-04-10
  Administered 2022-04-10: 15 mg via INTRAVENOUS
  Filled 2022-04-10: qty 1

## 2022-04-10 MED ORDER — SODIUM CHLORIDE 0.9 % IJ SOLN (CUSTOM)
3.0000 mL | INTRAMUSCULAR | Status: DC | PRN
Start: 2022-04-10 — End: 2022-04-10
  Administered 2022-04-10 (×2): 3 mL via INTRAVENOUS

## 2022-04-10 MED ORDER — SODIUM CHLORIDE 0.9 % IV SOLN
1000.0000 mg | INTRAVENOUS | Status: DC
Start: 2022-04-10 — End: 2022-04-10

## 2022-04-10 MED ORDER — SODIUM CHLORIDE 0.9 % IJ SOLN (CUSTOM)
3.0000 mL | Freq: Three times a day (TID) | INTRAMUSCULAR | Status: DC
Start: 2022-04-10 — End: 2022-04-10
  Administered 2022-04-10: 3 mL via INTRAVENOUS

## 2022-04-10 MED ORDER — ENOXAPARIN SODIUM 40 MG/0.4ML IJ SOSY
40.0000 mg | PREFILLED_SYRINGE | Freq: Every day | INTRAMUSCULAR | Status: DC
Start: 2022-04-10 — End: 2022-04-10
  Administered 2022-04-10: 40 mg via SUBCUTANEOUS
  Filled 2022-04-10: qty 1

## 2022-04-10 MED ORDER — ACETAMINOPHEN 325 MG PO TABS
975.0000 mg | ORAL_TABLET | Freq: Three times a day (TID) | ORAL | Status: DC
Start: 2022-04-10 — End: 2022-04-10

## 2022-04-10 MED ORDER — SODIUM CHLORIDE 0.9% TKO INFUSION
INTRAVENOUS | Status: DC | PRN
Start: 2022-04-10 — End: 2022-04-10

## 2022-04-10 MED ORDER — ACETAMINOPHEN 325 MG PO TABS
650.0000 mg | ORAL_TABLET | Freq: Four times a day (QID) | ORAL | Status: DC | PRN
Start: 2022-04-10 — End: 2022-04-10
  Administered 2022-04-10: 650 mg via ORAL
  Filled 2022-04-10: qty 2

## 2022-04-10 NOTE — Interdisciplinary (Signed)
Pt very agitated demanding a private room and food to his wife. I tried to talk to him but he started yelling that he needed to speak with the charge nurse. Security at bedside and pt decided to leave AMA. Dorthey Sawyer Removed pt's piv, pt signed AMA form and left.

## 2022-04-10 NOTE — Discharge Summary (Signed)
Patient Name:  Kerry Neal    Principal Diagnosis (required):  sepsis secondary to [facility] Problem List (required):  There are no hospital problems to display for this patient.      Additional Hospital Diagnoses ("rule out" or "suspected" diagnoses, etc.):              Meth Use Disorder    Procedures Performed During This Hospitalization (required):    X-Ray Chest Single View    Result Date: 04/09/2022  IMPRESSION: No acute process.    X-Ray Hand Minimum 3 Views - Right    Result Date: 04/09/2022  IMPRESSION: No soft tissue gas, radiopaque foreign body or acute osseous abnormality      Consultations Obtained During This Hospitalization:  None    Brief Hospital Course (required):    #Sepsis, likely due to SSTI (purulent)  # Right Finger Wound  Initially meeting SIRS criteria and with leukocytosis to 19.2 and subjective fever/chills. Most likely secondary to SSTI likely with MSSA v MRSA given purulent wounds with surrounding erythema. Right finger wound appeared to be infected with large blood blister. Patient received TDAP vaccine. Was treated with Vanc/Zosyn. TTE and CT hand were ordered, but patient left AMA prior to being completed. Patient was unhappy with room and requested single room his girlfriend could sleep in, and was upset with nursing that this could not be accommodated. Discharged with 7 days of augmentin BID.      #Meth use disorder  History of meth use, does not inject only smokes. Reports he is interested in quitting. Last used this morning, currently coming down. Patient AMAed before SW consult could be completed.      #Skin lesions  Reported long history since childhood of purulent wounds that then scar over. Likely 2/2 to chronic picking after receiving bug bites. Patient left AMA prior to wound consult.       Discharge Condition (required): Stable.        Mental Status Exam: Patient is alert and oriented to person, place, time, and situation.  Patient with decision making capacity,  understands risks of leaving, including worsening infection, and death. Patient conveyed that he will be going to [employer] from here for continued treatment. Did not wait to sign and receive AVS    Discharge Disposition:  Left hospital against medical advice.    The patient examined on the date of being discharged. Also d/w with the team. I agree with the above discharge instructions and details. . I spent 25 minutes on the patient's care unit in examination, reviewing, the preparation and execution of the hospital discharge. For additional details, reports, consultant notes and other medical records, please refer to Epic notes from patient's hospitalization.

## 2022-04-10 NOTE — Plan of Care (Signed)
Problem: Promotion of Health and Safety  Goal: Promotion of Health and Safety  Description: The patient remains safe, receives appropriate treatment and achieves optimal outcomes (physically, psychosocially, and spiritually) within the limitations of the disease process by discharge.    Information below is the current care plan.  Outcome: Progressing  Flowsheets  Taken 04/10/2022 0326  Individualized Interventions/Recommendations #1: Skin care  Individualized Interventions/Recommendations #2 (if applicable): Monitor VS q4  Individualized Interventions/Recommendations #3 (if applicable): Monitor for s/sx fo worsening infection  Individualized Interventions/Recommendations #4 (if applicable): Monitor and treat pain  Individualized Interventions/Recommendations #5 (if applicable): Cluster care to promote rest  Outcome Evaluation (rationale for progressing/not progressing) every shift: Pt tolerating POC  Taken 04/10/2022 0300  Patient /Family stated Goal: to get some pain medicine  Note:

## 2022-04-10 NOTE — Progress Notes (Signed)
Vancomycin Initiation Note  Vancomycin Indication: Sepsis (Goal: Trough 10 - 20 mg/L, AUC 400-600 mg-h/L)  32 year old male, Height: 5' 7"$  (170.2 cm), Weight: 69 kg (152 lb 1.9 oz), Kidney Function: SCr: 0.66 mg/dL, at baseline.    Assessment / Plan:   Initial Pharmacokinetic Parameters: Volume of distribution: 47 L, Clearance: 7.42 L/h, Half-life: 4.4h  A loading dose of 1569m IV was administered on 2/9 at 2344. Will start 10047mIV Q6H on 2/10 at 0600 for an estimated steady state trough of 10.46 mg/L and AUC 537 mg-h/L.   Monitor kidney function and plan trough level on 2/11 or sooner if clinically indicated.    Pharmacist will continue to monitor and make adjustments as needed.    CaSaundra ShellingStudent Pharmacist

## 2022-04-11 LAB — MRSA SURVEILLANCE CULTURE

## 2022-04-12 LAB — CONFIRM AMPHETAMINES-URINE
Conf. Amphetamine: POSITIVE ng/mL — AB
Conf. MDA: NEGATIVE ng/mL
Conf. MDMA: NEGATIVE ng/mL
Conf. Methamphetamine: POSITIVE ng/mL — AB

## 2022-04-12 LAB — HCV ANTIBODY WITH REFLEX QUANT: Hepatitis C Ab: NONREACTIVE

## 2022-04-13 LAB — CONFIRM CANNABINOIDS-URINE BY LC-MSMS, QUALITATIVE: Conf. THC: POSITIVE ng/mL — AB

## 2022-04-14 LAB — BLOOD CULTURE
Blood Culture Result: NO GROWTH
Blood Culture Result: NO GROWTH

## 2022-04-15 LAB — BLOOD CULTURE: Blood Culture Result: NO GROWTH

## 2023-04-09 ENCOUNTER — Emergency Department
Admission: EM | Admit: 2023-04-09 | Discharge: 2023-04-10 | Discharge status: Against Medical Advice | Payer: Self-pay | Attending: Emergency Medicine | Admitting: Emergency Medicine

## 2023-04-09 DIAGNOSIS — Z711 Person with feared health complaint in whom no diagnosis is made: Secondary | ICD-10-CM | POA: Insufficient documentation

## 2023-04-10 NOTE — ED Notes (Addendum)
Pt ambulatory to triage desk reporting he is leaving and does not wish to wait for MD eval

## 2024-02-03 DIAGNOSIS — F1591 Other stimulant use, unspecified, in remission: Secondary | ICD-10-CM | POA: Diagnosis present

## 2024-02-03 DIAGNOSIS — Z888 Allergy status to other drugs, medicaments and biological substances status: Secondary | ICD-10-CM

## 2024-02-03 DIAGNOSIS — F640 Transsexualism: Secondary | ICD-10-CM | POA: Diagnosis present

## 2024-02-03 DIAGNOSIS — Z5900 Homelessness unspecified: Secondary | ICD-10-CM

## 2024-02-03 DIAGNOSIS — F17210 Nicotine dependence, cigarettes, uncomplicated: Secondary | ICD-10-CM | POA: Diagnosis present

## 2024-02-03 DIAGNOSIS — Z881 Allergy status to other antibiotic agents status: Secondary | ICD-10-CM

## 2024-02-03 DIAGNOSIS — F419 Anxiety disorder, unspecified: Secondary | ICD-10-CM | POA: Diagnosis present

## 2024-02-03 DIAGNOSIS — Z560 Unemployment, unspecified: Secondary | ICD-10-CM

## 2024-02-03 DIAGNOSIS — S61230A Puncture wound without foreign body of right index finger without damage to nail, initial encounter: Secondary | ICD-10-CM | POA: Diagnosis present

## 2024-02-03 DIAGNOSIS — R700 Elevated erythrocyte sedimentation rate: Secondary | ICD-10-CM | POA: Diagnosis present

## 2024-02-03 DIAGNOSIS — R7982 Elevated C-reactive protein (CRP): Secondary | ICD-10-CM | POA: Diagnosis present

## 2024-02-03 DIAGNOSIS — Z880 Allergy status to penicillin: Secondary | ICD-10-CM

## 2024-02-03 DIAGNOSIS — F1191 Opioid use, unspecified, in remission: Secondary | ICD-10-CM | POA: Diagnosis present

## 2024-02-03 DIAGNOSIS — M65141 Other infective (teno)synovitis, right hand: Principal | ICD-10-CM | POA: Diagnosis present

## 2024-02-03 DIAGNOSIS — Z87892 Personal history of anaphylaxis: Secondary | ICD-10-CM

## 2024-02-03 DIAGNOSIS — W269XXA Contact with unspecified sharp object(s), initial encounter: Secondary | ICD-10-CM

## 2024-02-04 ENCOUNTER — Emergency Department (HOSPITAL_BASED_OUTPATIENT_CLINIC_OR_DEPARTMENT_OTHER): Payer: Self-pay

## 2024-02-04 ENCOUNTER — Inpatient Hospital Stay
Admission: EM | Admit: 2024-02-04 | Discharge: 2024-02-04 | DRG: 558 | Payer: Medicaid - Out of State | Attending: Internal Medicine | Admitting: Internal Medicine

## 2024-02-04 ENCOUNTER — Inpatient Hospital Stay (HOSPITAL_BASED_OUTPATIENT_CLINIC_OR_DEPARTMENT_OTHER): Payer: Self-pay

## 2024-02-04 DIAGNOSIS — M7989 Other specified soft tissue disorders: Secondary | ICD-10-CM

## 2024-02-04 DIAGNOSIS — M79645 Pain in left finger(s): Secondary | ICD-10-CM

## 2024-02-04 DIAGNOSIS — S61241A Puncture wound with foreign body of left index finger without damage to nail, initial encounter: Secondary | ICD-10-CM

## 2024-02-04 DIAGNOSIS — W228XXA Striking against or struck by other objects, initial encounter: Secondary | ICD-10-CM

## 2024-02-04 DIAGNOSIS — W268XXA Contact with other sharp object(s), not elsewhere classified, initial encounter: Secondary | ICD-10-CM

## 2024-02-04 DIAGNOSIS — L089 Local infection of the skin and subcutaneous tissue, unspecified: Principal | ICD-10-CM

## 2024-02-04 DIAGNOSIS — S61231A Puncture wound without foreign body of left index finger without damage to nail, initial encounter: Secondary | ICD-10-CM

## 2024-02-04 LAB — CBC WITH DIFF, BLOOD
ANC-Automated: 4 1000/mm3 (ref 1.6–7.0)
Abs Basophils: 0 1000/mm3 (ref <0.2)
Abs Eosinophils: 0.1 1000/mm3 (ref 0.0–0.5)
Abs Lymphs: 3.4 1000/mm3 — ABNORMAL HIGH (ref 0.8–3.1)
Abs Monos: 0.6 1000/mm3 (ref 0.2–0.8)
Basophils: 0.5 %
Eosinophils: 1.7 %
Hct: 41.1 % (ref 40.0–50.0)
Hgb: 13.7 g/dL (ref 13.7–17.5)
Imm Gran %: 0.2 % (ref <1)
Imm Gran Abs: 0 1000/mm3 (ref <0.1)
Lymphocytes: 41.8 %
MCH: 30.2 pg (ref 26.0–32.0)
MCHC: 33.3 g/dL (ref 32.0–36.0)
MCV: 90.7 um3 (ref 79.0–95.0)
MPV: 8.7 fL — ABNORMAL LOW (ref 9.4–12.4)
Monocytes: 7.6 %
Plt Count: 289 1000/mm3 (ref 140–370)
RBC: 4.53 mill/mm3 — ABNORMAL LOW (ref 4.60–6.10)
RDW: 13.2 % (ref 12.0–14.0)
Segs: 48.2 %
WBC: 8.2 1000/mm3 (ref 4.0–10.0)

## 2024-02-04 LAB — COMPREHENSIVE METABOLIC PANEL, BLOOD
ALT (SGPT): 15 U/L (ref 0–50)
AST (SGOT): 24 U/L (ref 0–50)
Albumin: 4 g/dL (ref 3.5–5.2)
Alkaline Phos: 72 U/L (ref 40–129)
Anion Gap: 9 mmol/L (ref 7–15)
BUN: 12 mg/dL (ref 6–20)
Bicarbonate: 27 mmol/L (ref 22–29)
Bilirubin, Tot: 0.47 mg/dL (ref <1.2)
Calcium: 9.4 mg/dL (ref 8.5–10.6)
Chloride: 105 mmol/L (ref 98–107)
Creatinine: 0.64 mg/dL — ABNORMAL LOW (ref 0.67–1.17)
Glucose: 104 mg/dL — ABNORMAL HIGH (ref 70–99)
Potassium: 3.6 mmol/L (ref 3.5–5.1)
Sodium: 141 mmol/L (ref 136–145)
Total Protein: 7.2 g/dL (ref 6.0–8.0)
eGFR Based on CKD-EPI 2021 Equation: >60 mL/min/1.73 m2

## 2024-02-04 LAB — C-REACTIVE PROTEIN, BLOOD: CRP: 1.75 mg/dL — ABNORMAL HIGH (ref <0.5)

## 2024-02-04 LAB — HIV 1/2 ANTIBODY & P24 ANTIGEN ASSAY, BLOOD: HIV 1/2 Antibody & P24 Antigen Assay: NONREACTIVE

## 2024-02-04 LAB — SED RATE, BLOOD: Sed Rate: 61 mm/h — ABNORMAL HIGH (ref 2–28)

## 2024-02-04 MED ORDER — IOHEXOL 350 MG/ML IV SOLN
500.0000 mL | Freq: Once | INTRAVENOUS | Status: AC
  Administered 2024-02-04: 100 mL via INTRAVENOUS
  Filled 2024-02-04: qty 500

## 2024-02-04 MED ORDER — TETANUS-DIPHTH-ACELL PERTUSSIS 5-2.5-18.5 LF-MCG/0.5 IM WRAPPED RECORD: 0.5000 mL | INTRAMUSCULAR | Status: DC

## 2024-02-04 MED ORDER — VANCOMYCIN HCL 1.5 G IV SOLR
1500.0000 mg | Freq: Once | INTRAVENOUS | Status: AC
  Administered 2024-02-04: 1500 mg via INTRAVENOUS
  Filled 2024-02-04: qty 1500

## 2024-02-04 MED ORDER — VANCOMYCIN HCL 1 GM IV SOLR: 1000.0000 mg | Freq: Three times a day (TID) | INTRAVENOUS | Status: DC

## 2024-02-04 MED ORDER — VANCOMYCIN PER PHARMACIST: INTRAVENOUS | Status: DC

## 2024-02-04 MED ORDER — HYDROCODONE-ACETAMINOPHEN 5-325 MG OR TABS
1.0000 | ORAL_TABLET | Freq: Once | ORAL | Status: AC
  Administered 2024-02-04: 1 via ORAL
  Filled 2024-02-04: qty 1

## 2024-02-04 MED ORDER — SODIUM CHLORIDE 0.9% TKO INFUSION: INTRAVENOUS | Status: DC | PRN

## 2024-02-04 MED ORDER — STERILE WATER FOR INJECTION IJ SOLN: 1000.0000 mg | INTRAMUSCULAR | Status: DC

## 2024-02-04 MED ORDER — TETANUS-DIPHTH-ACELL PERTUSSIS 5-2.5-18.5 LF-MCG/0.5 IM WRAPPED RECORD: 0.5000 mL | Freq: Once | INTRAMUSCULAR | Status: DC

## 2024-02-04 MED ORDER — STERILE WATER FOR INJECTION IJ SOLN: 2000.0000 mg | INTRAMUSCULAR | Status: DC

## 2024-02-04 MED ORDER — STERILE WATER FOR INJECTION IJ SOLN
2000.0000 mg | Freq: Once | INTRAMUSCULAR | Status: AC
  Administered 2024-02-04: 2000 mg via INTRAVENOUS
  Filled 2024-02-04: qty 2000

## 2024-02-04 MED ORDER — SODIUM CHLORIDE 0.9 % IJ SOLN (CUSTOM): 3.0000 mL | INTRAMUSCULAR | Status: DC | PRN

## 2024-02-04 MED ORDER — SODIUM CHLORIDE 0.9 % IJ SOLN (CUSTOM): 3.0000 mL | Freq: Three times a day (TID) | INTRAMUSCULAR | Status: DC

## 2024-02-04 MED ORDER — ENOXAPARIN SODIUM 40 MG/0.4ML IJ SOSY: 40.0000 mg | PREFILLED_SYRINGE | Freq: Every day | INTRAMUSCULAR | Status: DC

## 2024-02-04 NOTE — Progress Notes (Signed)
 Vancomycin  Initiation Note  For details on [place] Vancomycin  Dosing and monitoring, see [id number].  Vancomycin  Indication: Skin and Soft Tissue Infection (Goal: Trough 10 - 20 mg/L, AUC 400-600 mg-h/L)  Recent Labs     02/04/24  0111   CREAT 0.64*   WBC 8.2   ESR 61*   CRP 1.75*       Assessment / Plan:   33 year old male, Height: 5' 8 (172.7 cm), Weight: 67.1 kg (147 lb 14.9 oz)  Kidney Function: SCr: 0.64 mg/dL (0.8 for calculations), at baseline.  Initial Pharmacokinetic Parameters: Volume of distribution: 46.9 L, Clearance: 6.2 L/h, Half-life: 5.4 h  Give a loading dose of 1500mg . Will start vancomycin  1000mg  iv q8h for an estimated steady state trough of 10.1 mg/L and AUC 486 mg-h/L.   Monitor kidney function and plan random level on 12/7 or sooner if clinically indicated.    Pharmacist will continue to monitor and make adjustments as needed.    Aydn Ferrara Duy Berlie Persky

## 2024-02-04 NOTE — ED Notes (Signed)
 Ortho at bedside.

## 2024-02-04 NOTE — Discharge Summary (Signed)
 Date of Admission:  02/04/2024  Date of Discharge:  02/04/2024    Patient Name:  Kerry Neal    Principal Diagnosis (required):  Index finger SSTI c/f flexortenosynovitis    Hospital Problem List (required):  There are no hospital problems to display for this patient.    Principal Procedure Performed During This Hospitalization (required):  CT hand - not read by the time the patient left AMA    Consultations Obtained During This Hospitalization:  Ortho hand surgery - no formal recommendations at the time the patient left AMA    Reason for Admission to the Hospital / Initial Presentation:  History provided by the patient's wife, Kerry Neal who also goes by Kerry Neal, at bedside:  The patient was cleaning a Dab on Monday. A sharp piece of metal that she was using to clean the dab accidentally stabbed her in the index finger. They cleaned the wound. It was initially getting better until. Wednesday when it started to get swollen and worse. Then progressed to the point where she could not bend her finger at all. She began having fevers/chills and soaking through her sheets while she was sleeping. The patient did not want to come to the doctor but came in because her wife Kerry Neal convinced her it was bad enough that she needed antibiotics. The patient also endorses lethargy, fatigue, light-headedness.    Hospital Course by Problem (required):    The patient left hospital within 1 hour of admission. Became upset at NPO order. MD at bedside had risks benefits. The patient understood that she is at risk of losing her finger if she left the hospital before medical workup and management was completed. The patient had capacity and declined antibiotics.     Kerry Neal is a 33 year old male to male transgender (pronouns she/her/hers) patient w/ a PMHx of polysubstance use disorder (IV methamphetamine last use 4 weeks ago, fentanyl last use 1 year ago, nicotine  ongoing) who presents for L index finger SSTI c/f flexor  tenosynovitis.     # L index finger pain and swelling  # c/f flexor tenosynovitis  Presentation of finger pain with subjective f/c following sharp trauma with metal object. The patient cannot or is not willing to bend finger on exam. Will not allow examiner to bend finger either. Cannot exclude deep tissue injury such as FTS. History of penicillin allergy, although has previously tolerated CTX per prior admission notes.  - was planning to continue vancomycin  and start CTX, d/c aztreonam   - f/u CT hand  - orthopedic surgery did not have a chance to provide formal recommendations because CT Hand had just been performed right before AMA  - the patient declined oral antibiotics at the time of AMA    Tests Outstanding at Discharge Requiring Follow Up:  CT hand read    Discharge Condition (required):  Poor.    Key Physical Exam Findings at Discharge:  Mental Status Exam: Patient is alert and oriented to person, place, time, and situation.  L index finger wrapped with minimal mobility.    Discharge Disposition:  Eloped from hospital.        Discharge Code Status:  Full code / full care  This code status is not changed from the time of admission.    Discharge Medications:     What To Do With Your Medications        CONTINUE taking these medications        Add'l Info   naloxone  4 mg/0.1 mL nasal  spray  Commonly known as: NARCAN   FOR SUSPECTED OVERDOSE, call 911! Tilt head, spray one spray into one nostril as needed for respiratory depression. If patient does not respond or responds and then relapses, repeat using a new nasal spray TO THE OTHER NOSTRIL every 3 minutes until emergency medical assistance arrives.   Quantity: 2 each  Refills: 0              Allergies:  Allergies[1]    Follow Up Appointments:    Scheduled appointments:  No future appointments.    For appointments requested for after discharge that have not yet been scheduled, refer to the Post Discharge Referrals section of the After Visit Summary.    Discharging  Physician's Contact Information:  [facility] operator at [phone].         [1]   Allergies  Allergen Reactions    Cefaclor Anaphylaxis    Penicillins Anaphylaxis    Lamictal [Lamotrigine] Rash

## 2024-02-04 NOTE — Event / Update (Signed)
 Primary RN notified me of the patient's intent to leave the hospital against medical advice. At the time of my arrival the patient was cursing at staff and displaying aggressive behavior toward security guards as well as nursing staff. Was able to have brief risks and benefits conversation, the patient understood that she risks losing her finger if she left the hospital before workup was completed. She declined oral antibiotics prior to leaving.
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
## 2024-02-04 NOTE — ED Notes (Signed)
 Pt given water

## 2024-02-04 NOTE — ED Provider Notes (Signed)
 ED Provider Note  [facility] electronic medical record reviewed for pertinent medical history.     Kerry Neal DOB: 04-01-1990 PMD: No Pcp, Per Patient     ED Arrival Information       Expected   -    Arrival   02/03/2024 20:40    Acuity   Urgent/ESI Level 3              Means of arrival   Walk in    Escorted by   -    Service   Emergency Medicine    Admission type   Emergency              Arrival complaint   Finger Injury             Chief Complaint   Patient presents with    Finger Injury     Patient c/o swelling, redness, discharge and a foul odor to his left index finger. Patient unsure what caused these symptoms.    Derm Problem     Patient also c/o open lesions to both arms.       HPI: Kerry Neal is a 33 year old male who has past medical history of abscess of right pointer finger presenting to the emergency department with chief complaint of finger pain/swelling/redness/discharge for the last few days. Patient states that symptoms came on after being poked by a piece of metal. Patient believes they have an infection as it has been draining a foul-smelling odor. Patient believes that they have been having fever/chills but is not positive. Patient denies IV drug use, recreational drug use. Patient is currently un-housed. Patient has full range of motion of his hand, states that his sensation is intact. Patient denies any other bodily injury but notes that he has had scattered abrasions on bilateral upper extremities.          External Data Sources (Select all that apply):  primary care clinic note from (institution & date) 04/10/22 ED. Information obtained: History of polyp abscess.    Pertinent Medical History:    PMHx: As above    Past Surgical History[1]    Family History[2]    Current Outpatient Medications   Medication Instructions    naloxone  (NARCAN ) 4 mg/0.1 mL nasal spray FOR SUSPECTED OVERDOSE, call 911! Tilt head, spray one spray into one nostril as needed for respiratory depression. If patient does not  respond or responds and then relapses, repeat using a new nasal spray TO THE OTHER NOSTRIL every 3 minutes until emergency medical assistance arrives.       Physical Exam  BP 156/86   Pulse 90   Temp 98 F (36.7 C)   Resp 17   Ht 5' 8 (1.727 m)   Wt 67.1 kg (147 lb 14.9 oz)   SpO2 99%   BMI 22.49 kg/m   Physical Exam  GEN: Alert. No acute distress. Non-toxic appearing. Disheveled appearance.  HENT: Head is normocephalic, atraumatic. Conjunctiva without injection. Moist mucus membranes.  CV: Regular rate. Regular rhythm. No murmurs.   PULM: Lungs are clear to auscultation bilaterally. Normal work of breathing. No wheezes, rales, rhonchi.  GI: Abdomen is soft, non-distended. Non-tender. No guarding, rebound, or rigidity..   MSK: No pedal edema.   SKIN: Left index finger diffusely swollen with overlying erythema/skin breakdown. Normal range of motion of the finger, no increased pain with passive extension. Nontender to palpation throughout flexor tendon sheath. Brisk radial pulse. Can make okay sign, peace sign, thumbs-up.  Sensation intact in the median, ulnar, radial nerve distribution. Scattered abrasions on bilateral upper extremities.  NEURO: Oriented to history with appropriate responses and reliable recall. No facial droop. Normal speech. Follows commands appropriately.   PSYCH: Appropriate mood and affect.    Orders/Medications    Orders Placed This Encounter   Procedures    X-Ray Hand Minimum 3 Views - Left    CT Lt Upper Extremity With IV Contrast    CMP    HIV 1/2 Antibody & P24 Antigen Assay    Syphilis Screen, Blood Yellow serum separator tube    CBC w/ Diff Lavender    Sedimentation Rate (ESR), Blood Lavender    C-Reactive Protein, Blood Green Plasma Separator Tube    IP Consult to Orthopedics, Hand    IP Consult to Medicine, Hospitalist       Medications   iohexol  (OMNIPAQUE  350) 350 MG/ML solution 500 mL (has no administration in time range)   VANCOMYCIN  PER PHARMACIST (has no administration in  time range)   cefTRIAXone  (ROCEPHIN ) 2,000 mg in sterile water  (PF) 20 mL IV (has no administration in time range)   vancomycin  (VANCOCIN ) 1,500 mg in sodium chloride  0.9 % 500 mL IVPB (1,500 mg IntraVENOUS New Bag 02/04/24 0220)   aztreonam  (AZACTAM ) 2,000 mg in sterile water  (PF) 10 mL IV (2,000 mg IntraVENOUS Given 02/04/24 0220)   HYDROcodone -acetaminophen  (NORCO) 5-325 MG tablet 1 tablet (1 tablet Oral Given 02/04/24 0216)           Medical Decision Making/Assessment/Plan    The patient presented with several days of left index finger pain, swelling, erythema, and purulent, foul-smelling drainage following a puncture injury from a piece of metal, as well as open lesions on both arms. He reported possible subjective fevers and chills but was unsure, and denied IV drug use or other recreational drug use. On arrival, he was hemodynamically stable and afebrile, with a non-toxic appearance. Exam revealed a diffusely swollen, erythematous left index finger with overlying skin breakdown and normal range of motion, no tenderness along the flexor tendon sheath, and intact sensation and perfusion. Scattered abrasions were noted on both upper extremities.    The leading consideration was a soft tissue infection of the left index finger, likely a paronychia or felon, given the history of trauma, purulent drainage, and local findings. Deep space infection such as flexor tenosynovitis was considered but was less likely given the absence of pain with passive extension, lack of fusiform swelling, and preserved range of motion. Osteomyelitis was considered given the chronicity and radiographic finding of a punctate soft tissue radiopacity. Cellulitis with or without abscess was also considered, as was the possibility of a retained foreign body. Systemic infection was less likely given the absence of leukocytosis, stable vital signs, and reassuring metabolic panel, though the elevated ESR and CRP were consistent with an  inflammatory process. The patient was empirically treated with IV vancomycin , ceftriaxone , and aztreonam , and received hydrocodone -acetaminophen  for pain control. Consulted ortho hand to evaluate, they recommended CT imaging. Paged hospital medicine team to request admission. Hospital Medicine admitted this patient. Patient amenable with treatment plan, stable at time of disposition.        ED Course/Updates/Disposition  ED Course as of 02/04/24 0522   Geofm [facility] Documentation   Dju Feb 04, 2024   0216 CBC w/ Diff Lavender(!)  No leukocytosis, no anemia, no thrombocytopenia   0216 Sed Rate(!): 61   0216 CRP(!): 1.75   0216 CMP(!)  No major  electrolyte abnormalities, reassuring CMP   0222 X-Ray Hand Minimum 3 Views - Left    No acute fracture or dislocation.     Marked diffuse swelling of the index finger. Punctate soft tissue radiopacity projects near the tuft of the second digit. Recommend correlation with physical exam findings.     0308 Ortho hand is going to come and evaluate patient.   [id number] Paged hospital medicine   [id number]: requesting admission for index finger infection requiring IV antibiotics and ortho hand consult         ED Clinical Impression as of 02/04/24 0522   Finger infection                     After discussion with the team of Dr. Robynn Twyla Salina of the Medicine [facility] service, the decision was made to admit the patient to the hospital with a diagnosis of Finger infection [L08.9].     Data Reviewed:      Limited Bedside Ultrasound Procedure:   All images archived on [facility] servers.        Risk of Complications and/or Morbidity:                     [1] No past surgical history on file.  [2] No family history on file.       Geofm Lamar Solian, MD  Resident  02/04/24 [phone]

## 2024-02-04 NOTE — H&P (Signed)
 Internal Medicine History & Physical:  Banyan Goodchild, 1990-08-02, [id number]  02/04/2024    Chief Complaint:  L index finger pain    History of Present Illness:     Phillippe Orlick is a 33 year old male to male transgender (pronouns she/her/hers) patient w/ a PMHx of polysubstance use disorder (IV methamphetamine last use 4 weeks ago, fentanyl last use 1 year ago, nicotine  ongoing) who presents for L index finger SSTI c/f flexor tenosynovitis.    History provided by the patient's wife, Deidre who also goes by [facility], at bedside:  The patient was cleaning a Dab on Monday. A sharp piece of metal that she was using to clean the dab accidentally stabbed her in the index finger. They cleaned the wound. It was initially getting better until. Wednesday when it started to get swollen and worse. Then progressed to the point where she could not bend her finger at all. She began having fevers/chills and soaking through her sheets while she was sleeping. The patient did not want to come to the doctor but came in because her wife Deidre convinced her it was bad enough that she needed antibiotics. The patient also endorses lethargy, fatigue, light-headedness.    Denies SOB, CP, abdominal pain, n/v/c/d.    Last use of IV methamphetamine was ~4 weeks ago  Last use of IV fentanyl/opiates was ~ 1 year ago  Ongoing nicotine  use, does not want patch would consider lozanges    ED Course:  S/p aztreonam  2000 mg  S/p vanco  S/p norco    --------------------------------------------------------------------------------------------------------------------  Past Medical History:   Past Medical History[1]    Past Surgical History:   Past Surgical History[2]    Allergies:  Allergies[3]    Medications:  Prior to Admission Medications   Prescriptions Last Dose Informant Patient Reported? Taking?   naloxone  (NARCAN ) 4 mg/0.1 mL nasal spray Over a Month  No No   Sig: FOR SUSPECTED OVERDOSE, call 911! Tilt head, spray one spray into one nostril  as needed for respiratory depression. If patient does not respond or responds and then relapses, repeat using a new nasal spray TO THE OTHER NOSTRIL every 3 minutes until emergency medical assistance arrives.      Facility-Administered Medications: None     Social History:  -Alcohol use: minimal  -Tobacco use: 2 cigarettes/day  -Recreational drug use: methamphetamine and fentayl, in remission  -Current living situation: unhoused  -ADLs: indedependent    Family History:  noncontributory    Review of Systems:  See HPI    Physical Exam:  Temp  Avg: 98 F (36.7 C)  Min: 98 F (36.7 C)  Max: 98 F (36.7 C)  Pulse  Avg: 92.5  Min: 90  Max: 95  BP  Min: 156/86  Max: 157/105  No data recorded  Resp  Avg: 17  Min: 17  Max: 17  SpO2  Avg: 98.5 %  Min: 98 %  Max: 99 %    General: NAD  HEENT: NCAT, PERRLA  CV: RRR, no m/r/g  Resp: CTAB, no increased WOB. No retractions or accessory muscle use  Abdomen: Soft, NTND  Neuro: AAOx4, sensation and strength grossly normal, answering questions appropriately  Extremities:  No LE edema    Labs and Other Data:  Recent Labs     02/04/24  0111   NA 141   K 3.6   CL 105   BICARB 27   BUN 12   CREAT 0.64*   CA 9.4  TP 7.2   ALB 4.0   TBILI 0.47   AST 24   ALT 15   ALK 72     Recent Labs     02/04/24  0111   WBC 8.2   HGB 13.7   HCT 41.1   MCV 90.7   PLT 289   SEG 48.2   LYMPHS 41.8   MONOS 7.6   EOS 1.7     No results for input(s): PTT, INR in the last 72 hours.    Lab Results   Component Value Date    COLORUA Light-Yellow 04/09/2022    APPEARUA Clear 04/09/2022    GLUCOSEUA Negative 04/09/2022    BILIUA Negative 04/09/2022    KETONEUA Negative 04/09/2022    SGUA 1.025 04/09/2022    BLOODUA Negative 04/09/2022    PHUA 5.5 04/09/2022    PROTEINUA Trace (A) 04/09/2022    UROBILUA Negative 04/09/2022    NITRITEUA Negative 04/09/2022    LEUKESTUA Negative 04/09/2022    WBCUA 0-2 04/09/2022    RBCUA 0-2 04/09/2022    HYALINEUA 0-2 04/09/2022     Imaging:  X-Ray Hand Minimum 3 Views -  Left  Result Date: 02/04/2024  IMPRESSION: THIS PRELIMINARY INTERPRETATION HAS NOT BEEN REVIEWED BY AN ATTENDING RADIOLOGIST: No acute fracture or dislocation. Marked diffuse swelling of the index finger. Punctate soft tissue radiopacity projects near the tuft of the second digit. Recommend correlation with physical exam findings.     Assessment and Care Plan:  Gunnar Hereford is a 33 year old male to male transgender (pronouns she/her/hers) patient w/ a PMHx of polysubstance use disorder (IV methamphetamine last use 4 weeks ago, fentanyl last use 1 year ago, nicotine  ongoing) who presents for L index finger SSTI c/f flexor tenosynovitis.    # L index finger pain and swelling  # c/f flexor tenosynovitis  Presentation of finger pain with subjective f/c following sharp trauma with metal object. The patient cannot or is not willing to bend finger on exam. Will not allow examiner to bend finger either. Cannot exclude deep tissue injury such as FTS. History of penicillin allergy, although has previously tolerated CTX per prior admission notes.  - cont vancomycin  from ED  - START ceftriaxone   - d/c aztreonam   - f/u CT hand  - discuss with ortho hand surgery, already consulted in ED  - no TDAP indicated, previously vaccinated 04/2022    Fluids: ok for fluids  Electrolytes: Replete PRN  Nutrition: No diet orders on file  VTE PPX: LMWH  Access: PIVs  Foley: No foley catheter  Code: Full Code    Discussed with Attending Lady Robynn Fish, MD, who agrees with above plan.    Toribio Seats, MD PhD  [facility]  Resident Physician PGY-3       [1] No past medical history on file.  [2] No past surgical history on file.  [3]   Allergies  Allergen Reactions    Cefaclor Anaphylaxis    Penicillins Anaphylaxis    Lamictal [Lamotrigine] Rash

## 2024-02-04 NOTE — ED Notes (Addendum)
 Pt informed of room placement and NPO order for probable procedure. Pt upset at the fact that he cannot eat or drink or that wife cannot stay at bedside. Per floor RN, wife can come in. Pt upset at the fact that he is NPO and threatening  to leave. MD aware. Pt verbalized that he has not eat in 2 days, pt educated in the need for NPO for procedure and reminded pt had snacks earlier. Pt escalating and threatening to go to [employer] and leave AMA. PT informed we need to take IV out. Pt yelling at this time using foul language and verbal aggressions including bitch and do not get close to me. Partner at bedside verbalizing that they will sue this facility. This RN stepped back for safety. Security at bedside. Pt refusing to take IV out.     0600 Lucienne, RN able to DC IV from pt. Ginette, MD at bedside with pt. Pt yelling I am going to [place] Ginette MD offering DC abx, pt refused.

## 2024-02-04 NOTE — ED Floor Report (Signed)
 ED to IP Handoff    Report created by Darice Maritza Boas at 4:59 AM 02/04/2024.     HANDOFF REPORT UPDATE/CHANGES (changes in patient status/care/events prior to transfer)  By who:  Time:   Additional information:                                                                                                                                                     Kerry Neal is a 33 year old male.    Brief Summary of ED Visit (to include focused assessment and neuro status):  Kerry Neal is a 33 year old male who has past medical history of polyp abscess of right pointer finger presenting to the emergency department with chief complaint of finger pain/swelling/redness/discharge for the last few days. Patient states that his symptoms came on after being poked by a piece of metal. Patient believes they has not infection as it has been draining a foul-smelling odor. Patient believes that he has been having fever/chills. Patient denies IV drug use, recreational drug use. Patient is currently un-housed. Patient has full range of motion of his hand, states that his sensation is intact. Patient denies any other bodily injury but notes that he has had scattered blisters on bilateral upper extremities.     RN shift assessment exceptions to WDL: See above    Any significant events and interventions with responses:  See above    Radiologic studies not completed: CT   (None unless otherwise noted)    Chief Complaint   Patient presents with    Finger Injury     Patient c/o swelling, redness, discharge and a foul odor to his left index finger. Patient unsure what caused these symptoms.    Derm Problem     Patient also c/o open lesions to both arms.       Admitted for: celulitis     Code Status:  Please refer to In-pt admitting doctors orders     Level of Care: MS     Sepsis Section:    Is patient septic? no If yes, complete below:    BC x 2 drawn? yes  If No explain:      Repeat lactate needed? no  If Yes, when is it due?    _________________________________________________________________    Is patient on Heparin? no If yes, complete below:     Cardiac rhythm: NSR    Oxygen Delivery: None    Past Medical History[1]    Past Surgical History[2]    Allergies: Cefaclor, Penicillins, and Lamictal [lamotrigine]    ED Fall Risk:      Skin issues:  yes    >> If yes, note areas of skin breakdown. See appropriate photos.      Ambulatory:  yes    Sitter needed: no    CSSRS Suicide Risk  Level Screening in Triage: Minimal Risk    Does this patient have a history of aggressive behavior and/or is this patient a green banner patient? no    Isolation Required: no     >> If yes , what type of isolation:     Is patient in custody?  no    Is patient in restraints? no    Swallow Screen:      Swallow Screen Result:      Was the patient made NPO & a Speech Language Pathology Consult Ordered? no    Vitals:    02/03/24 2045 02/04/24 0250   BP: (!) 157/105 156/86   Pulse: 95 90   Resp: 17    Temp: 98 F (36.7 C)    SpO2: 98% 99%   Weight: 67.1 kg (147 lb 14.9 oz)    Height: 5' 8 (1.727 m)             Lab Results   Component Value Date    WBC 8.2 02/04/2024    RBC 4.53 (L) 02/04/2024    HGB 13.7 02/04/2024    HCT 41.1 02/04/2024    MCV 90.7 02/04/2024    MCHC 33.3 02/04/2024    RDW 13.2 02/04/2024    PLT 289 02/04/2024    MPV 8.7 (L) 02/04/2024       Lab Results   Component Value Date    NA 141 02/04/2024    K 3.6 02/04/2024    CL 105 02/04/2024    BICARB 27 02/04/2024    BUN 12 02/04/2024    CREAT 0.64 (L) 02/04/2024    GLU 104 (H) 02/04/2024    CA 9.4 02/04/2024       No results found for: BNP, PHOS, MG, LACTATE, AMMONIA, IONCA, ARTIONCA    No results found for: CPK, CKMBH, TROPONIN    No results found for: PH, PCO2, O2CONTENT, IVHC3, IVBE, O2SAT, UNPH, UNPCO2, ARTPH, ARTPCO2, ARTO2CNT, IAHC3, IABE, ARTO2SAT, UNAPH, UNAPCO2    No results found for this visit on 02/04/24.      Patient Lines/Drains/Airways  Status       Active PICC Line / CVC Line / PIV Line / Drain / Airway / Intraosseous Line / Epidural Line / ART Line / Line Type / Wound / Pressure Ulcer Injury       Name Placement date Placement time Site Days    Peripheral IV - 18 G Left;Lower Forearm 02/04/24  0119  Forearm  less than 1                        ED Handoff Report is ready for review.  Admitting RN may reach Emergency Department RN, Darice Maritza Boas, at [phone] with any questions.        [1] No past medical history on file.  [2] No past surgical history on file.

## 2024-02-04 NOTE — ED Notes (Signed)
 Dr. Geofm and Dr. Arch speaking with patient in triage.

## 2024-02-04 NOTE — ED Notes (Signed)
 Pt to CT

## 2024-02-04 NOTE — ED Notes (Signed)
 Pt eating sand which and snacks at this time

## 2024-02-06 LAB — SYPHILIS EIA SCREEN, BLOOD: Syphilis Screen: NEGATIVE

## 2024-02-09 LAB — BLOOD CULTURE
Blood Culture Result: NO GROWTH
Blood Culture Result: NO GROWTH
